# Patient Record
Sex: Male | Born: 1965 | Hispanic: No
Health system: Southern US, Community
[De-identification: ages and names within clinical notes are randomized; demographics above are authoritative.]

## PROBLEM LIST (undated history)

## (undated) HISTORY — PX: TONSILLECTOMY: SUR1361

---

## 2017-10-11 ENCOUNTER — Emergency Department: Payer: Self-pay

## 2017-10-11 DIAGNOSIS — F17210 Nicotine dependence, cigarettes, uncomplicated: Secondary | ICD-10-CM | POA: Insufficient documentation

## 2017-10-11 DIAGNOSIS — R0789 Other chest pain: Secondary | ICD-10-CM | POA: Insufficient documentation

## 2017-10-11 DIAGNOSIS — Z9109 Other allergy status, other than to drugs and biological substances: Secondary | ICD-10-CM | POA: Insufficient documentation

## 2017-10-11 LAB — CBC
HCT: 42.2 % (ref 40.0–52.0)
Hemoglobin: 14.1 g/dL (ref 13.0–18.0)
MCH: 31.5 pg (ref 26.0–34.0)
MCHC: 33.5 g/dL (ref 32.0–36.0)
MCV: 94.2 fL (ref 80.0–100.0)
Platelets: 225 10*3/uL (ref 150–440)
RBC: 4.48 MIL/uL (ref 4.40–5.90)
RDW: 12.3 % (ref 11.5–14.5)
WBC: 6.6 10*3/uL (ref 3.8–10.6)

## 2017-10-11 LAB — BASIC METABOLIC PANEL
Anion gap: 7 (ref 5–15)
BUN: 16 mg/dL (ref 6–20)
CALCIUM: 9.3 mg/dL (ref 8.9–10.3)
CHLORIDE: 102 mmol/L (ref 101–111)
CO2: 32 mmol/L (ref 22–32)
CREATININE: 0.92 mg/dL (ref 0.61–1.24)
Glucose, Bld: 112 mg/dL — ABNORMAL HIGH (ref 65–99)
Potassium: 3.2 mmol/L — ABNORMAL LOW (ref 3.5–5.1)
SODIUM: 141 mmol/L (ref 135–145)

## 2017-10-11 LAB — TROPONIN I

## 2017-10-11 NOTE — ED Triage Notes (Signed)
Patient c/o chest pain X 30 minutes. Patient reports 1 previous episode at [facility] 1 month ago. Patient has not gone to follow up. Patient reports concurrent symptoms of weakness, dizziness, lightheadedness.

## 2017-10-12 ENCOUNTER — Emergency Department
Admission: EM | Admit: 2017-10-12 | Discharge: 2017-10-12 | Disposition: A | Discharge status: Home / Self Care | Payer: Self-pay | Attending: Emergency Medicine | Admitting: Emergency Medicine

## 2017-10-12 DIAGNOSIS — Z9109 Other allergy status, other than to drugs and biological substances: Secondary | ICD-10-CM

## 2017-10-12 DIAGNOSIS — R0789 Other chest pain: Secondary | ICD-10-CM

## 2017-10-12 MED ORDER — CETIRIZINE HCL 10 MG PO TABS: 10.0000 mg | ORAL_TABLET | Freq: Every day | ORAL | 0 refills | Status: AC

## 2017-10-12 MED ORDER — GI COCKTAIL ~~LOC~~
30.0000 mL | Freq: Once | ORAL | Status: AC
  Administered 2017-10-12: 30 mL via ORAL
  Filled 2017-10-12: qty 30

## 2017-10-12 NOTE — ED Notes (Signed)

## 2017-10-12 NOTE — ED Notes (Signed)
Pt states "it feels like a big bubble that is right here (points to middle of chest/epiggastric region), I think it's gas, but the cardiologist says it's not gas"

## 2017-10-12 NOTE — ED Notes (Signed)
ED Provider at bedside. 

## 2017-10-12 NOTE — Discharge Instructions (Signed)
Please make an appointment to establish care with primary care within the next 2 days for recheck.  Begin taking your allergy medication as prescribed to help with your itching.  Return to the emergency department for any concerns whatsoever.  It was a pleasure to take care of you today, and thank you for coming to our emergency department.  If you have any questions or concerns before leaving please ask the nurse to grab me and I'm more than happy to go through your aftercare instructions again.  If you were prescribed any opioid pain medication today such as Norco, Vicodin, Percocet, morphine, hydrocodone, or oxycodone please make sure you do not drive when you are taking this medication as it can alter your ability to drive safely.  If you have any concerns once you are home that you are not improving or are in fact getting worse before you can make it to your follow-up appointment, please do not hesitate to call [phone] and come back for further evaluation.  Merrily BrittleNeil Sobia Karger, MD  Results for orders placed or performed during the hospital encounter of 10/12/17  Basic metabolic panel  Result Value Ref Range   Sodium 141 135 - 145 mmol/L   Potassium 3.2 (L) 3.5 - 5.1 mmol/L   Chloride 102 101 - 111 mmol/L   CO2 32 22 - 32 mmol/L   Glucose, Bld 112 (H) 65 - 99 mg/dL   BUN 16 6 - 20 mg/dL   Creatinine, Ser 8.650.92 0.61 - 1.24 mg/dL   Calcium 9.3 8.9 - 78.410.3 mg/dL   GFR calc non Af Amer >60 >60 mL/min   GFR calc Af Amer >60 >60 mL/min   Anion gap 7 5 - 15  CBC  Result Value Ref Range   WBC 6.6 3.8 - 10.6 K/uL   RBC 4.48 4.40 - 5.90 MIL/uL   Hemoglobin 14.1 13.0 - 18.0 g/dL   HCT 69.642.2 29.540.0 - 28.452.0 %   MCV 94.2 80.0 - 100.0 fL   MCH 31.5 26.0 - 34.0 pg   MCHC 33.5 32.0 - 36.0 g/dL   RDW 13.212.3 44.011.5 - 10.214.5 %   Platelets 225 150 - 440 K/uL  Troponin I  Result Value Ref Range   Troponin I <0.03 <0.03 ng/mL   Dg Chest 2 View  Result Date: 10/11/2017 CLINICAL DATA:  Chest pain for 30 minutes EXAM:  CHEST  2 VIEW COMPARISON:  None. FINDINGS: The heart size and mediastinal contours are within normal limits. Both lungs are clear. The visualized skeletal structures are unremarkable. IMPRESSION: No active cardiopulmonary disease. Electronically Signed   By: Alcide CleverMark  Lukens M.D.   On: 10/11/2017 21:11

## 2017-10-12 NOTE — ED Provider Notes (Signed)
[facility] Emergency Department Provider Note  ____________________________________________   First MD Initiated Contact with Patient 10/12/17 0205     (approximate)  I have reviewed the triage vital signs and the nursing notes.   HISTORY  Chief Complaint Chest Pain   HPI Angel Page is a 52 y.o. male who self presents to the emergency department with atypical chest pain that began roughly 30 minutes prior to arrival.  His pain is described as "a bubble" in his upper abdomen and lower chest.  It is constant, nonradiating, nonexertional.  Not associated with diaphoresis.  Mild nausea.  He says he had one previous episode roughly 1 month ago and he was seen at [facility] where he had a cardiac catheterization that was reportedly normal.  He was told "I think it is a muscle spasm".  History reviewed. No pertinent past medical history.  There are no active problems to display for this patient.   Past Surgical History:  Procedure Laterality Date  . TONSILLECTOMY      Prior to Admission medications   Medication Sig Start Date End Date Taking? Authorizing Provider  cetirizine (ZYRTEC ALLERGY) 10 MG tablet Take 1 tablet (10 mg total) by mouth daily. 10/12/17   Merrily Brittle, MD    Allergies Patient has no known allergies.  No family history on file.  Social History Social History   Tobacco Use  . Smoking status: Current Every Day Smoker  . Smokeless tobacco: Never Used  Substance Use Topics  . Alcohol use: No    Frequency: Never  . Drug use: Not on file    Review of Systems Constitutional: No fever/chills Eyes: No visual changes. ENT: No sore throat. Cardiovascular: Positive for chest pain. Respiratory: Denies shortness of breath. Gastrointestinal: Positive for abdominal pain.  Positive for nausea, no vomiting.  No diarrhea.  No constipation. Genitourinary: Negative for dysuria. Musculoskeletal: Negative for back pain. Skin:  Negative for rash. Neurological: Negative for headaches, focal weakness or numbness.   ____________________________________________   PHYSICAL EXAM:  VITAL SIGNS: ED Triage Vitals  Enc Vitals Group     BP 10/11/17 2052 120/81     Pulse Rate 10/11/17 2052 78     Resp 10/11/17 2052 19     Temp 10/11/17 2052 98.7 F (37.1 C)     Temp Source 10/11/17 2052 Oral     SpO2 10/11/17 2052 100 %     Weight 10/11/17 2044 150 lb (68 kg)     Height 10/11/17 2044 5\' 5"  (1.651 m)     Head Circumference --      Peak Flow --      Pain Score 10/11/17 2044 8     Pain Loc --      Pain Edu? --      Excl. in GC? --     Constitutional: Alert and oriented x4 well-appearing nontoxic no diaphoresis speaks full clear sentences Eyes: PERRL EOMI. Head: Atraumatic. Nose: No congestion/rhinnorhea. Mouth/Throat: No trismus Neck: No stridor.   Cardiovascular: Normal rate, regular rhythm. Grossly normal heart sounds.  Good peripheral circulation. Respiratory: Normal respiratory effort.  No retractions. Lungs CTAB and moving good air Gastrointestinal: Soft nontender Musculoskeletal: No lower extremity edema   Neurologic:  Normal speech and language. No gross focal neurologic deficits are appreciated. Skin:  Skin is warm, dry and intact. No rash noted. Psychiatric: Mood and affect are normal. Speech and behavior are normal.    ____________________________________________   DIFFERENTIAL includes but not limited to  Acute coronary syndrome, pulmonary embolism, gastritis, gastric reflux, esophagitis ____________________________________________   LABS (all labs ordered are listed, but only abnormal results are displayed)  Labs Reviewed  BASIC METABOLIC PANEL - Abnormal; Notable for the following components:      Result Value   Potassium 3.2 (*)    Glucose, Bld 112 (*)    All other components within normal limits  CBC  TROPONIN I    Lab work reviewed by me with no signs of acute  ischemia __________________________________________  EKG  ED ECG REPORT I, Merrily BrittleNeil Maelie Chriswell, the attending physician, personally viewed and interpreted this ECG.  Date: 10/12/2017 EKG Time:  Rate: 87 Rhythm: normal sinus rhythm QRS Axis: normal Intervals: First-degree AV block ST/T Wave abnormalities: normal Narrative Interpretation: no evidence of acute ischemia  ____________________________________________  RADIOLOGY  Chest x-ray reviewed by me with no acute disease ____________________________________________   PROCEDURES  Procedure(s) performed: no  Procedures  Critical Care performed: no  Observation: no ____________________________________________   INITIAL IMPRESSION / ASSESSMENT AND PLAN / ED COURSE  Pertinent labs & imaging results that were available during my care of the patient were reviewed by me and considered in my medical decision making (see chart for details).  Patient arrives very well-appearing with atypical chest pain, negative troponin, normal chest x-ray, normal EKG after having a normal cardiac catheterization 1 month ago.  My suspicion for acute coronary syndrome is quite low.  He feels improved after GI cocktail.  He also reports feeling itching daily for the past several months that is improved with Benadryl.  I have advised him to begin taking cetirizine to help with his symptoms.  I will refer him back to primary care.  He is discharged home in improved condition verbalized understanding agree with plan.      ____________________________________________   FINAL CLINICAL IMPRESSION(S) / ED DIAGNOSES  Final diagnoses:  Atypical chest pain  Environmental allergies      NEW MEDICATIONS STARTED DURING THIS VISIT:  This SmartLink is deprecated. Use AVSMEDLIST instead to display the medication list for a patient.   Note:  This document was prepared using Dragon voice recognition software and may include unintentional dictation  errors.     Merrily Brittleifenbark, Nereyda Bowler, MD 10/12/17 [phone]

## 2019-08-30 IMAGING — CR DG CHEST 2V
1 series · 2 of 2 positions shown · non-contrast
Comparison: None.

CLINICAL DATA: Chest pain for 30 minutes

EXAM:
CHEST  2 VIEW

[Series 1: w chest pa · 0.14mm/px · 2 of 2 slices shown]
[im 1/2]
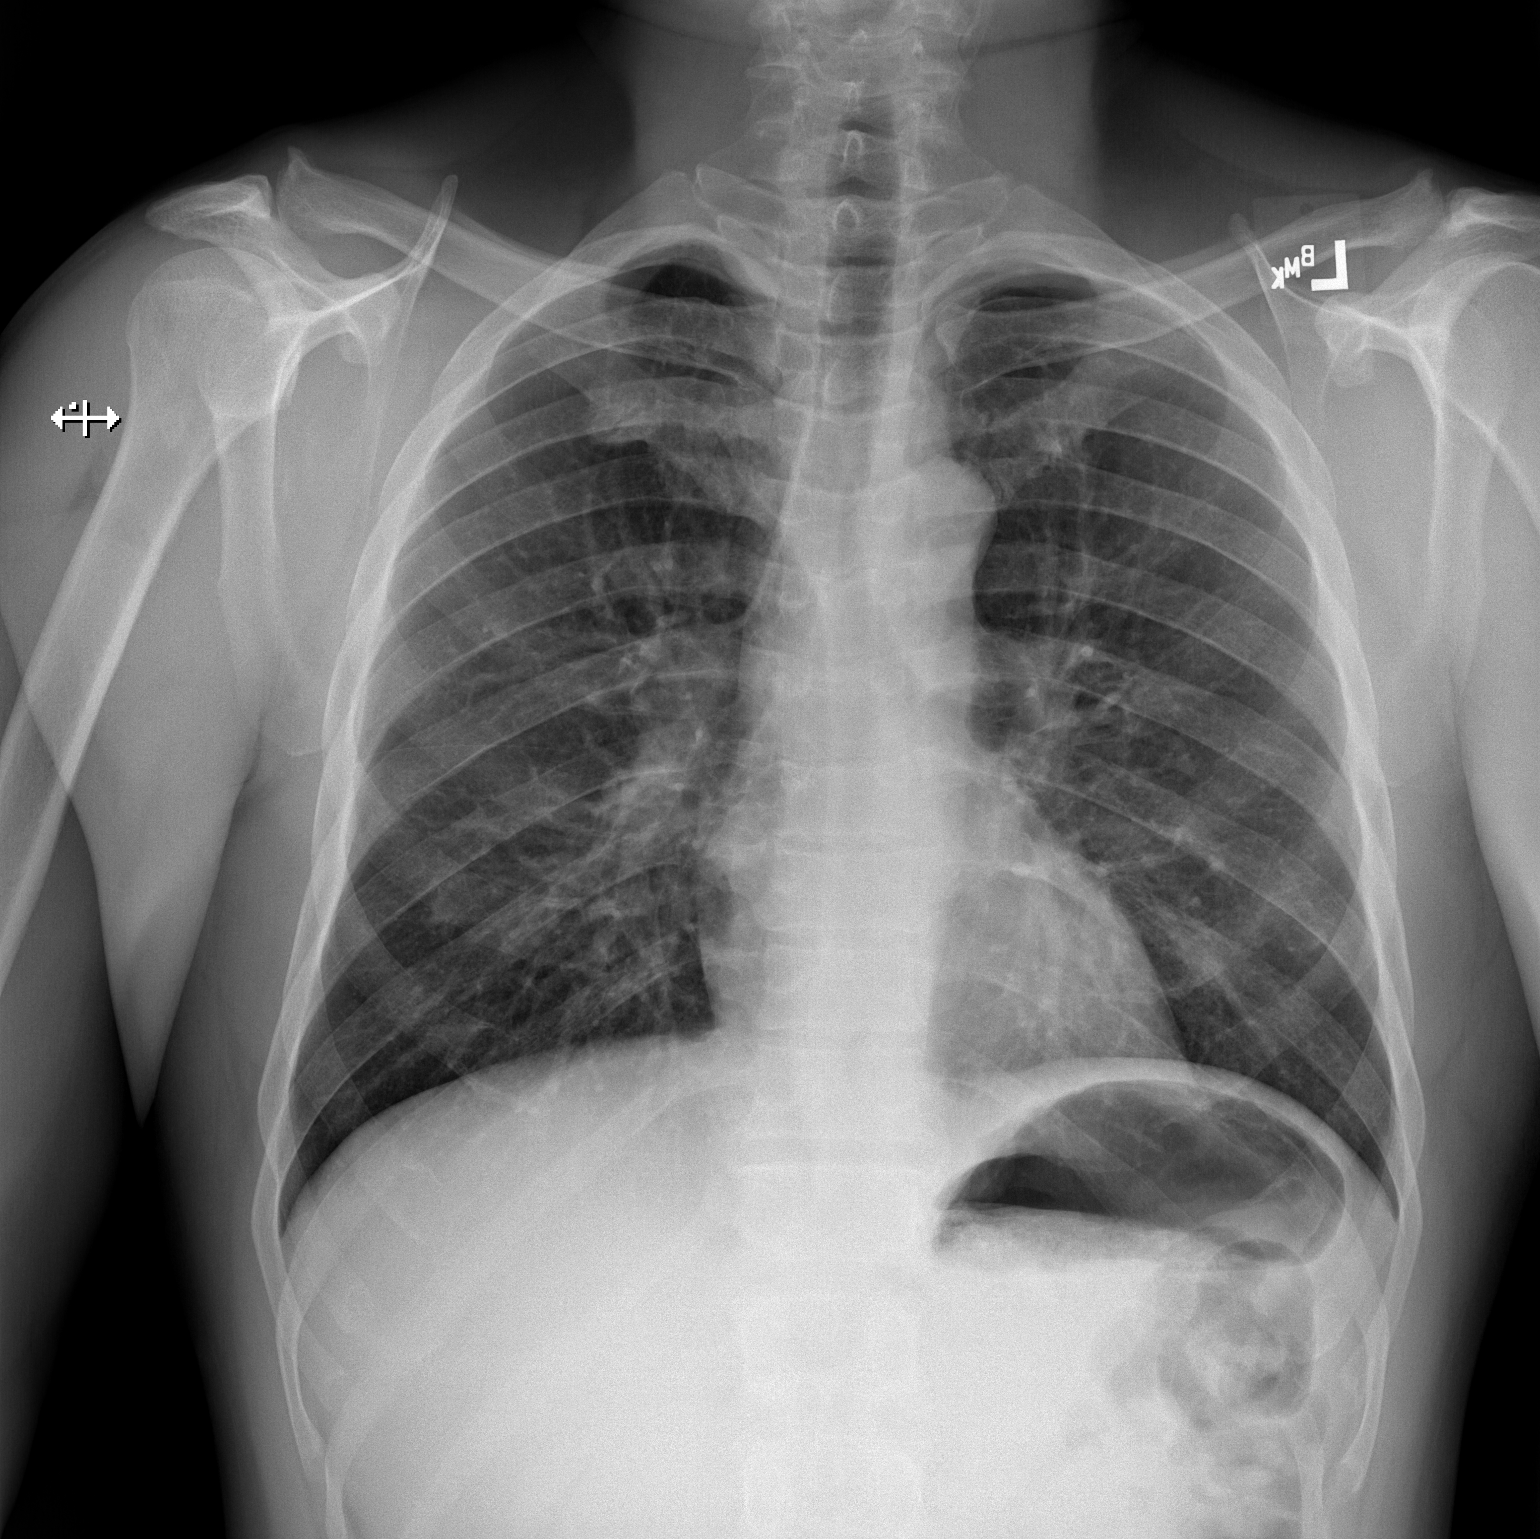
[im 2/2]
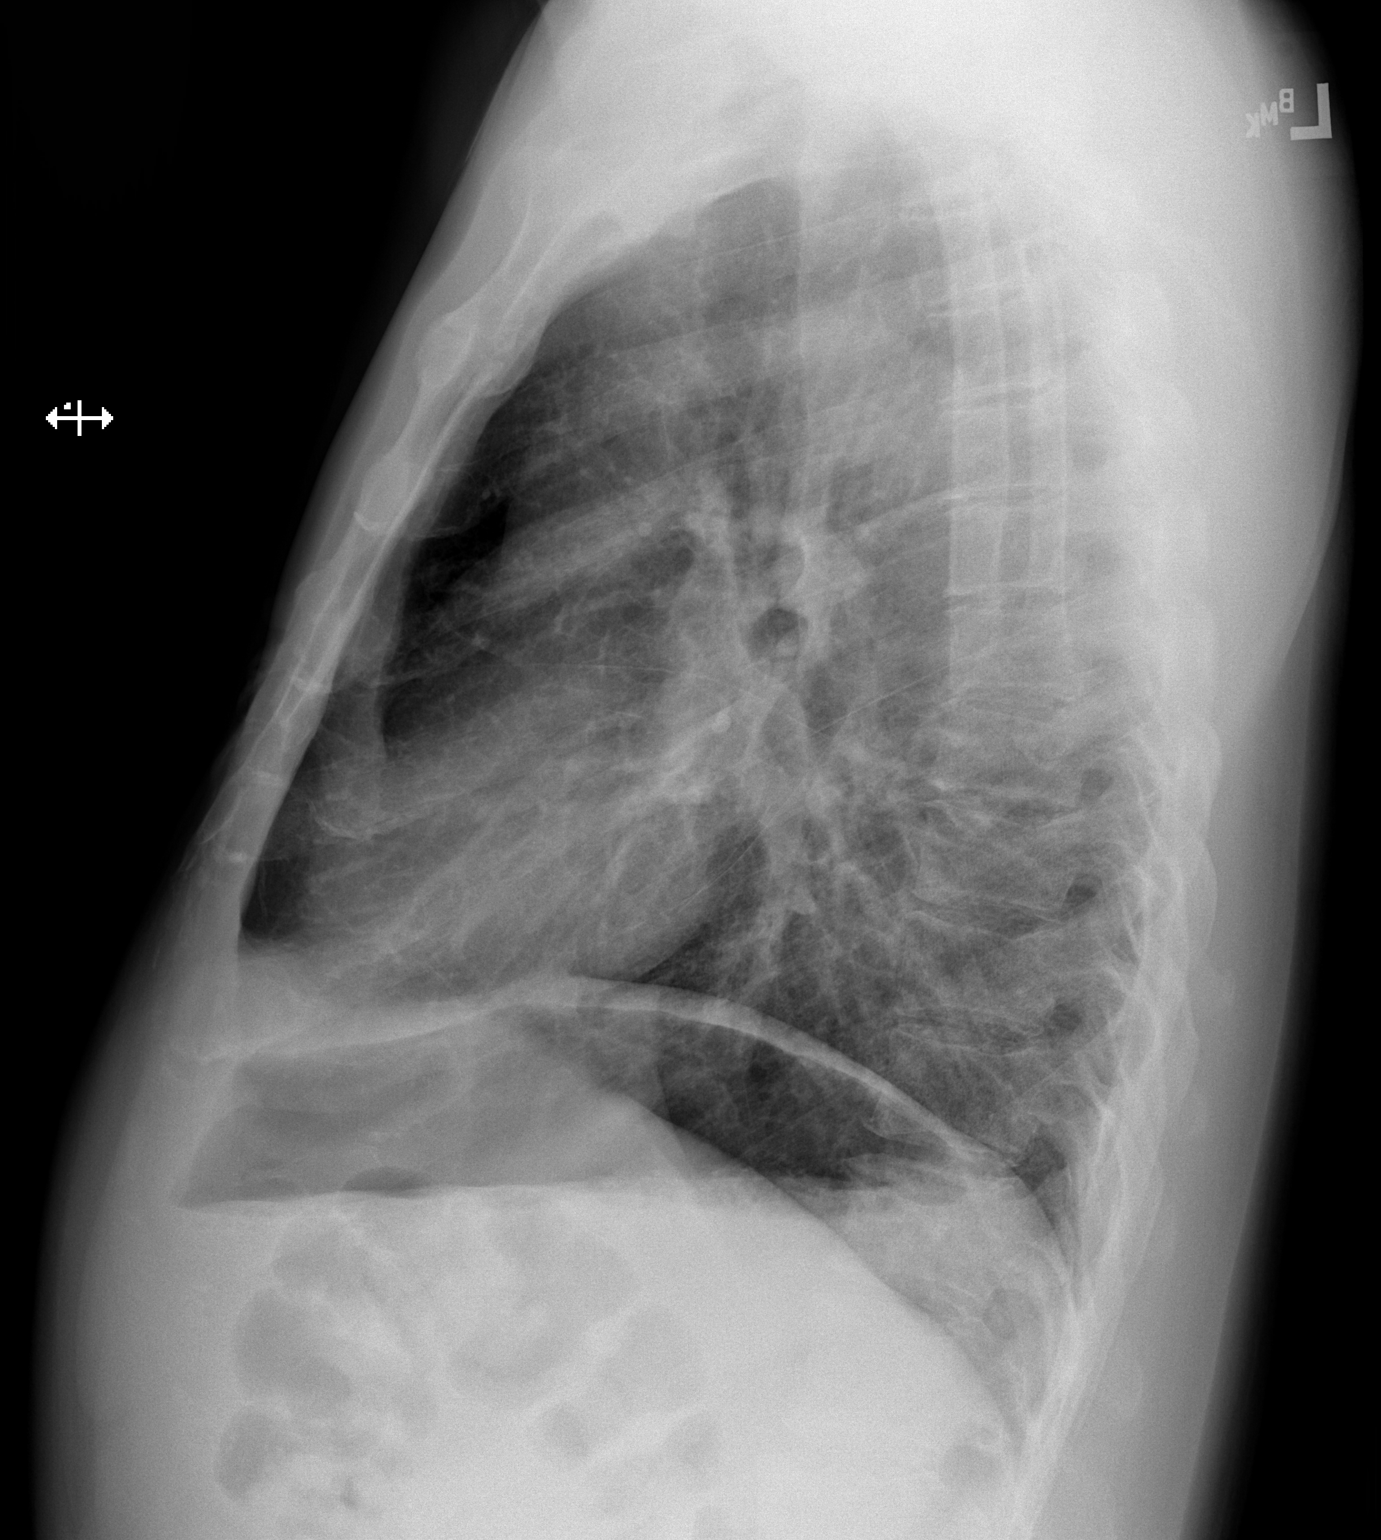

[2 of 2 positions shown; findings below may reference images not displayed]

FINDINGS: The heart size and mediastinal contours are within normal limits.
Both lungs are clear. The visualized skeletal structures are
unremarkable.
IMPRESSION: No active cardiopulmonary disease.

## 2020-01-09 ENCOUNTER — Ambulatory Visit: Payer: Self-pay | Attending: Internal Medicine

## 2022-01-16 ENCOUNTER — Other Ambulatory Visit: Payer: Self-pay

## 2022-01-16 ENCOUNTER — Emergency Department: Payer: Self-pay

## 2022-01-16 ENCOUNTER — Emergency Department
Admission: EM | Admit: 2022-01-16 | Discharge: 2022-01-16 | Disposition: A | Discharge status: Home / Self Care | Payer: Self-pay | Attending: Emergency Medicine | Admitting: Emergency Medicine

## 2022-01-16 ENCOUNTER — Encounter: Payer: Self-pay | Admitting: Emergency Medicine

## 2022-01-16 DIAGNOSIS — S0990XA Unspecified injury of head, initial encounter: Secondary | ICD-10-CM

## 2022-01-16 DIAGNOSIS — X18XXXA Contact with other hot metals, initial encounter: Secondary | ICD-10-CM | POA: Insufficient documentation

## 2022-01-16 DIAGNOSIS — S0101XA Laceration without foreign body of scalp, initial encounter: Secondary | ICD-10-CM | POA: Insufficient documentation

## 2022-01-16 DIAGNOSIS — Y990 Civilian activity done for income or pay: Secondary | ICD-10-CM | POA: Insufficient documentation

## 2022-01-16 NOTE — ED Provider Notes (Signed)
? ?[facility] ?Provider Note ? ? ? Event Date/Time  ? First MD Initiated Contact with Patient 01/16/22 1827   ?  (approximate) ? ? ?History  ? ?Head Injury ? ? ?HPI ?Angel Page is a 56 y.o. male blunt trauma to the left frontal skull resulting in minor laceration.  No hemorrhaging.  Denies LOC.  Denies vision disturbance or vertigo.  Rates pain as a 4/10.  Described pain as "sore".  Sterile dressing applied in triage. ?  ? ? ?Physical Exam  ? ?Triage Vital Signs: ?ED Triage Vitals  ?Enc Vitals Group  ?   BP 01/16/22 1741 127/84  ?   Pulse Rate 01/16/22 1741 70  ?   Resp 01/16/22 1741 18  ?   Temp 01/16/22 1741 98.1 ?F (36.7 ?C)  ?   Temp Source 01/16/22 1741 Oral  ?   SpO2 01/16/22 1741 99 %  ?   Weight 01/16/22 1740 140 lb (63.5 kg)  ?   Height 01/16/22 1740 5\' 5"  (1.651 m)  ?   Head Circumference --   ?   Peak Flow --   ?   Pain Score 01/16/22 1740 4  ?   Pain Loc --   ?   Pain Edu? --   ?   Excl. in GC? --   ? ? ?Most recent vital signs: ?Vitals:  ? 01/16/22 1741  ?BP: 127/84  ?Pulse: 70  ?Resp: 18  ?Temp: 98.1 ?F (36.7 ?C)  ?SpO2: 99%  ? ? ?General: Awake, no distress.  ?CV:  Good peripheral perfusion.  ?Resp:  Normal effort.  ?Abd:  No distention.  ?Other:  Superficial laceration to the left superior aspect of forehead. ? ? ?ED Results / Procedures / Treatments  ? ?Labs ?(all labs ordered are listed, but only abnormal results are displayed) ?Labs Reviewed - No data to display ? ? ?EKG ? ? ? ? ?RADIOLOGY ?No acute findings x-ray of the skull.  Radiology confirmed no acute findings. ? ? ?PROCEDURES: ? ?Critical Care performed: No ? ?03/18/22.Laceration Repair ? ?Date/Time: 01/16/2022 7:47 PM ?Performed by: 03/18/2022, PA-C ?Authorized by: Joni Reining, PA-C  ? ?Consent:  ?  Consent obtained:  Verbal ?  Consent given by:  Patient ?  Risks, benefits, and alternatives were discussed: yes   ?  Risks discussed:  Infection, pain, poor cosmetic result, need for additional repair and poor  wound healing ?Universal protocol:  ?  Procedure explained and questions answered to patient or proxy's satisfaction: yes   ?  Relevant documents present and verified: yes   ?  Test results available: no   ?  Imaging studies available: yes   ?  Required blood products, implants, devices, and special equipment available: no   ?  Site/side marked: no   ?  Immediately prior to procedure, a time out was called: yes   ?  Patient identity confirmed:  Verbally with patient and arm band ?Anesthesia:  ?  Anesthesia method:  None ?Laceration details:  ?  Location:  Scalp ?  Scalp location:  Frontal ?  Length (cm):  0.5 ?  Depth (mm):  2 ?Pre-procedure details:  ?  Preparation:  Patient was prepped and draped in usual sterile fashion and imaging obtained to evaluate for foreign bodies ?Exploration:  ?  Limited defect created (wound extended): no   ?  Hemostasis achieved with:  Direct pressure ?  Imaging outcome: foreign body not noted   ?  Contaminated:  no   ?Treatment:  ?  Area cleansed with:  Povidone-iodine and saline ?  Amount of cleaning:  Standard ?  Debridement:  None ?  Undermining:  None ?  Scar revision: no   ?Skin repair:  ?  Repair method:  Tissue adhesive ?Approximation:  ?  Approximation:  Close ?Repair type:  ?  Repair type:  Simple ?Post-procedure details:  ?  Dressing:  Open (no dressing) ?  Procedure completion:  Tolerated well, no immediate complications ? ? ?MEDICATIONS ORDERED IN ED: ?Medications - No data to display ? ? ?IMPRESSION / MDM / ASSESSMENT AND PLAN / ED COURSE  ?I reviewed the triage vital signs and the nursing notes. ?             ?               ? ?Differential diagnosis includes, but is not limited to, skull fracture, foreign body, laceration. ? ?  ?Discussed no acute findings x-ray with patient.  See procedure note for wound closure ? ?FINAL CLINICAL IMPRESSION(S) / ED DIAGNOSES  ? ?Final diagnoses:  ?Minor head injury without loss of consciousness, initial encounter  ?Laceration of scalp,  initial encounter  ? ? ? ?Rx / DC Orders  ? ?ED Discharge Orders   ? ? None  ? ?  ? ? ? ?Note:  This document was prepared using Dragon voice recognition software and may include unintentional dictation errors. ? ?  ?Joni Reining, PA-C ?01/16/22 1949 ? ?  ?Georga Hacking, MD ?01/16/22 2345 ? ?

## 2022-01-16 NOTE — Discharge Instructions (Signed)
No acute findings x-ray of the skull.  Read and follow Dermabond care instructions.  No creams or ointments to the area.  Return to ED if condition worsens. ?

## 2022-01-16 NOTE — ED Triage Notes (Signed)
Pt via POV from home. Pt states he was at work and he hit his head on a piece of metal. Denies LOC. Denies blood thinner. Unknown last tetanus. States that he did fall back but nothing else is hurting him. Pt is A&Ox4 and NAD.  ? ?Pt is workers comp case ?

## 2023-12-05 IMAGING — CR DG SKULL COMPLETE 4+V
3 series · 3 of 3 positions shown · non-contrast
Comparison: None.

CLINICAL DATA: Trauma

EXAM:
SKULL - COMPLETE 4 + VIEW

[skull [person_name]]
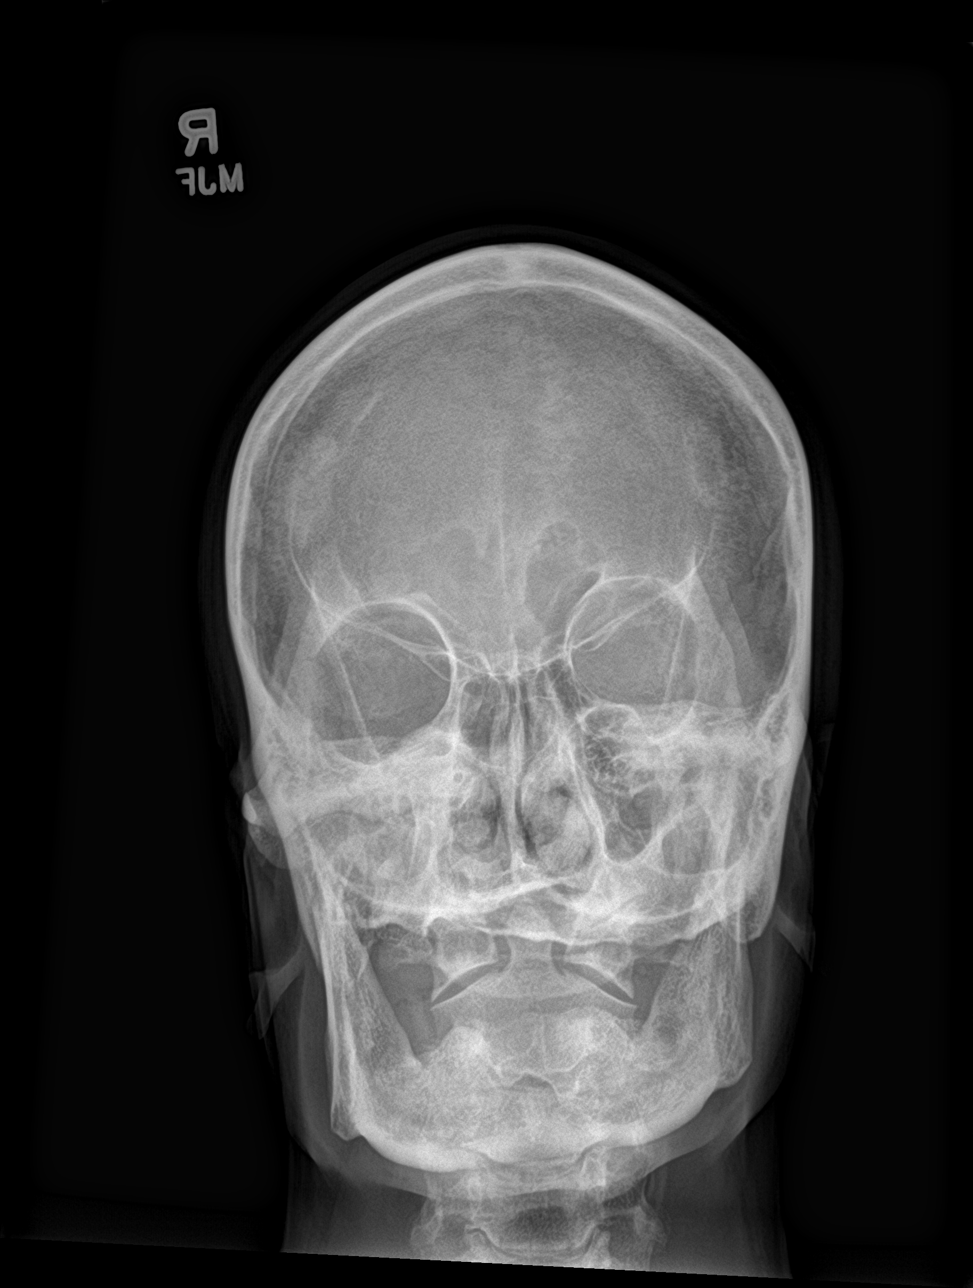

[skull towns]
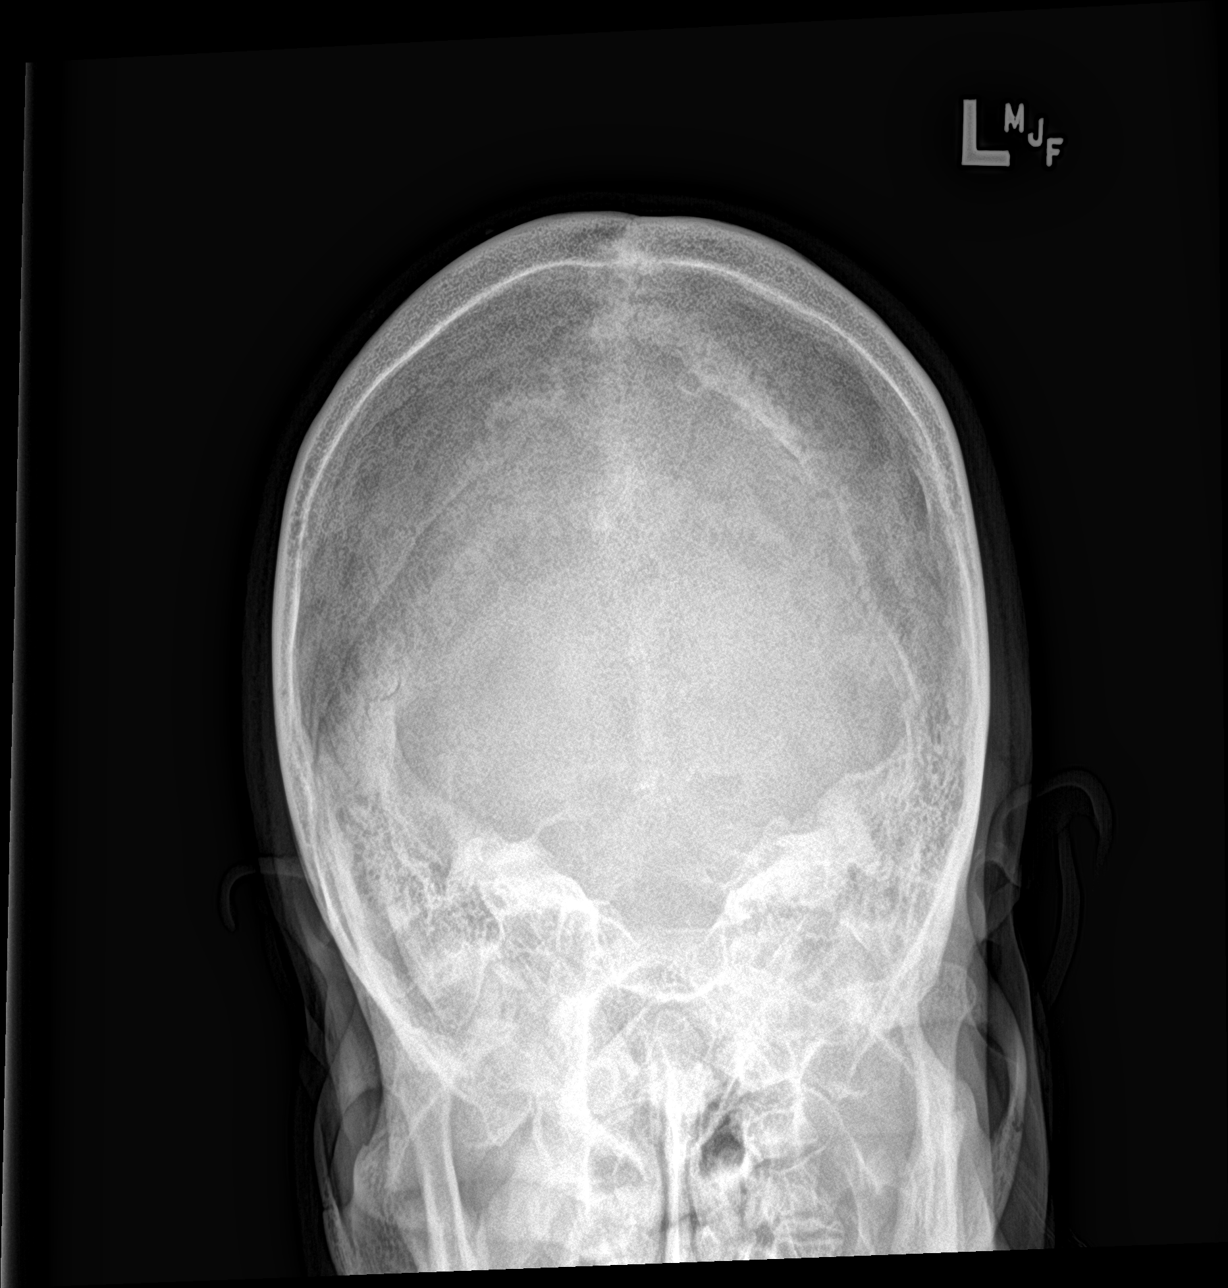

[skull lat]
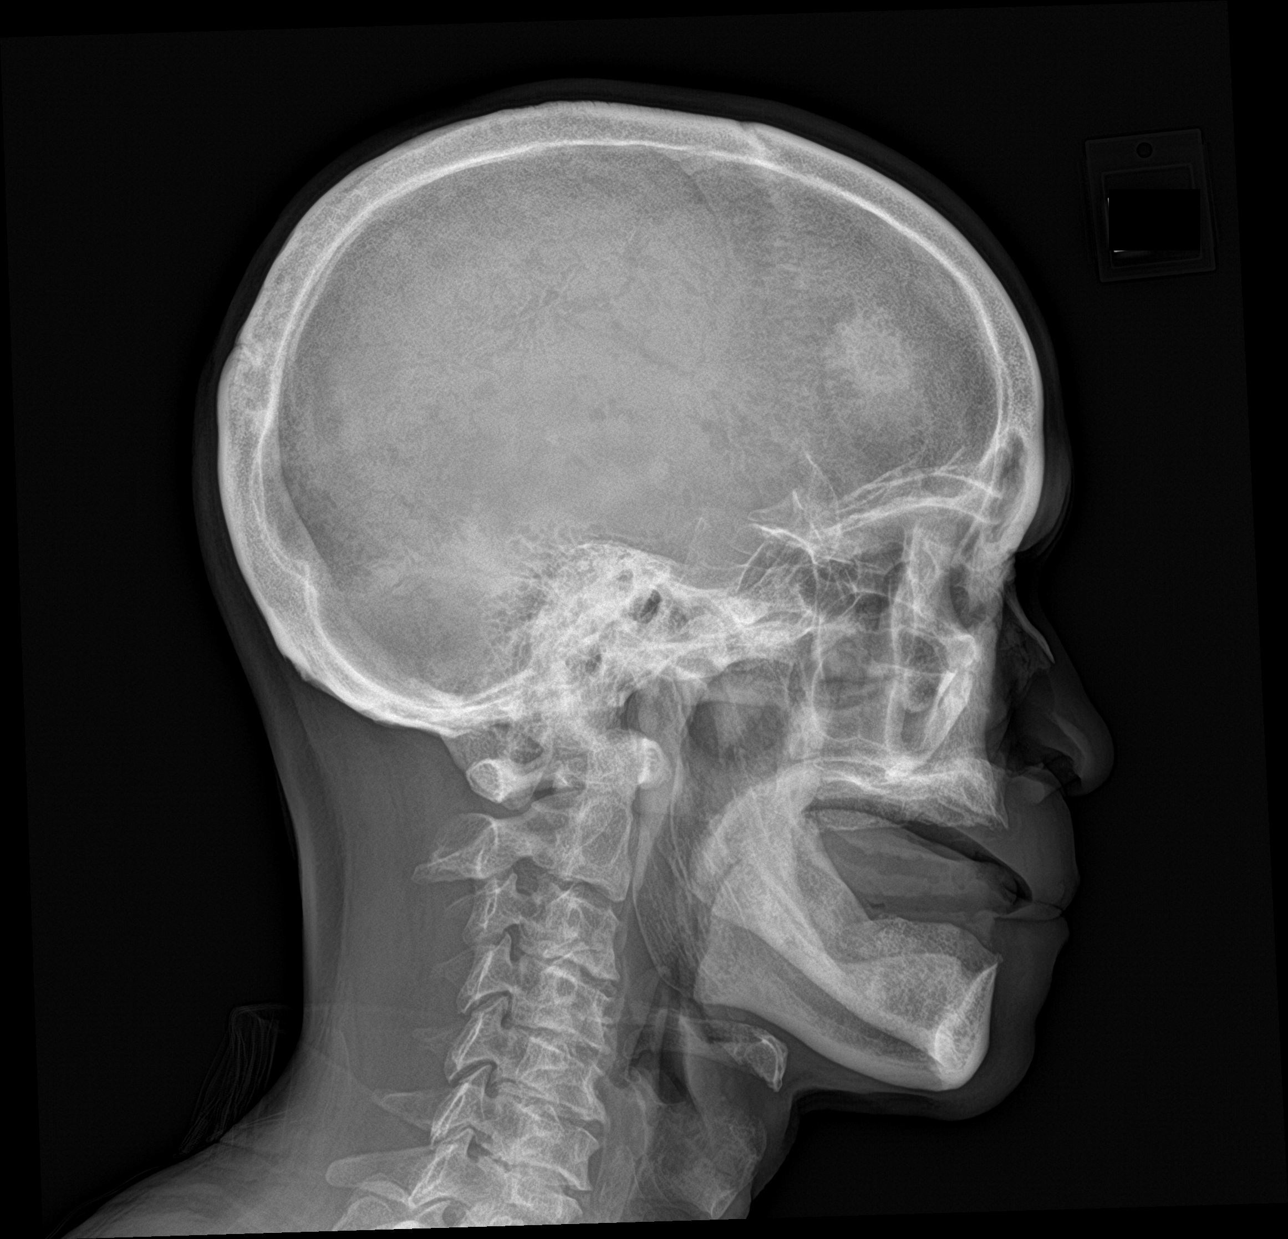

[3 of 3 positions shown; findings below may reference images not displayed]

FINDINGS: No definitive fracture or malalignment. Mild patchy nonspecific
sclerosis in the right frontal bone. Edentulous patient.
IMPRESSION: No definite acute osseous abnormality.
